# Patient Record
Sex: Male | Born: 1995 | Race: White | Hispanic: No | Marital: Single
Health system: Southern US, Community
[De-identification: ages and names within clinical notes are randomized; demographics above are authoritative.]

---
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
## 2015-11-01 ENCOUNTER — Telehealth: Payer: Self-pay | Admitting: Family Medicine

## 2016-01-03 NOTE — Telephone Encounter (Signed)
error
